# Patient Record
Sex: Male | Born: 2000 | Race: White | Hispanic: No | Marital: Single | Smoking: Never smoker
Health system: Southern US, Community
[De-identification: ages and names within clinical notes are randomized; demographics above are authoritative.]

---
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
## 2022-12-09 ENCOUNTER — Encounter (HOSPITAL_BASED_OUTPATIENT_CLINIC_OR_DEPARTMENT_OTHER): Payer: Self-pay | Admitting: Emergency Medicine

## 2022-12-09 ENCOUNTER — Other Ambulatory Visit: Payer: Self-pay

## 2022-12-09 ENCOUNTER — Emergency Department (HOSPITAL_BASED_OUTPATIENT_CLINIC_OR_DEPARTMENT_OTHER)
Admission: EM | Admit: 2022-12-09 | Discharge: 2022-12-09 | Disposition: A | Discharge status: Home / Self Care | Payer: BC Managed Care – PPO | Attending: Emergency Medicine | Admitting: Emergency Medicine

## 2022-12-09 DIAGNOSIS — L03116 Cellulitis of left lower limb: Secondary | ICD-10-CM | POA: Diagnosis not present

## 2022-12-09 DIAGNOSIS — M79605 Pain in left leg: Secondary | ICD-10-CM | POA: Diagnosis present

## 2022-12-09 DIAGNOSIS — L0291 Cutaneous abscess, unspecified: Secondary | ICD-10-CM

## 2022-12-09 LAB — CBC WITH DIFFERENTIAL/PLATELET
Abs Immature Granulocytes: 0.01 10*3/uL (ref 0.00–0.07)
Basophils Absolute: 0 10*3/uL (ref 0.0–0.1)
Basophils Relative: 0 %
Eosinophils Absolute: 0.3 10*3/uL (ref 0.0–0.5)
Eosinophils Relative: 4 %
HCT: 42.7 % (ref 39.0–52.0)
Hemoglobin: 14.7 g/dL (ref 13.0–17.0)
Immature Granulocytes: 0 %
Lymphocytes Relative: 26 %
Lymphs Abs: 2.3 10*3/uL (ref 0.7–4.0)
MCH: 29.3 pg (ref 26.0–34.0)
MCHC: 34.4 g/dL (ref 30.0–36.0)
MCV: 85.1 fL (ref 80.0–100.0)
Monocytes Absolute: 0.4 10*3/uL (ref 0.1–1.0)
Monocytes Relative: 5 %
Neutro Abs: 5.7 10*3/uL (ref 1.7–7.7)
Neutrophils Relative %: 65 %
Platelets: 377 10*3/uL (ref 150–400)
RBC: 5.02 MIL/uL (ref 4.22–5.81)
RDW: 12.9 % (ref 11.5–15.5)
WBC: 8.8 10*3/uL (ref 4.0–10.5)
nRBC: 0 % (ref 0.0–0.2)

## 2022-12-09 LAB — URINALYSIS, W/ REFLEX TO CULTURE (INFECTION SUSPECTED)
Bacteria, UA: NONE SEEN
Bilirubin Urine: NEGATIVE
Glucose, UA: NEGATIVE mg/dL
Hgb urine dipstick: NEGATIVE
Ketones, ur: NEGATIVE mg/dL
Leukocytes,Ua: NEGATIVE
Nitrite: NEGATIVE
Protein, ur: NEGATIVE mg/dL
Specific Gravity, Urine: 1.027 (ref 1.005–1.030)
pH: 5.5 (ref 5.0–8.0)

## 2022-12-09 LAB — LACTIC ACID, PLASMA: Lactic Acid, Venous: 0.9 mmol/L (ref 0.5–1.9)

## 2022-12-09 LAB — COMPREHENSIVE METABOLIC PANEL
ALT: 26 U/L (ref 0–44)
AST: 19 U/L (ref 15–41)
Albumin: 4.7 g/dL (ref 3.5–5.0)
Alkaline Phosphatase: 45 U/L (ref 38–126)
Anion gap: 10 (ref 5–15)
BUN: 13 mg/dL (ref 6–20)
CO2: 28 mmol/L (ref 22–32)
Calcium: 9.5 mg/dL (ref 8.9–10.3)
Chloride: 102 mmol/L (ref 98–111)
Creatinine, Ser: 0.98 mg/dL (ref 0.61–1.24)
GFR, Estimated: >60 mL/min (ref >60)
Glucose, Bld: 85 mg/dL (ref 70–99)
Potassium: 3.9 mmol/L (ref 3.5–5.1)
Sodium: 140 mmol/L (ref 135–145)
Total Bilirubin: 0.5 mg/dL (ref 0.3–1.2)
Total Protein: 7.8 g/dL (ref 6.5–8.1)

## 2022-12-09 MED ORDER — DOXYCYCLINE HYCLATE 100 MG PO CAPS: 100.0000 mg | ORAL_CAPSULE | Freq: Two times a day (BID) | ORAL | 0 refills | Status: AC

## 2022-12-09 MED ORDER — LIDOCAINE-EPINEPHRINE (PF) 2 %-1:200000 IJ SOLN
10.0000 mL | Freq: Once | INTRAMUSCULAR | Status: AC
  Administered 2022-12-09: 10 mL
  Filled 2022-12-09: qty 20
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
## 2022-12-09 NOTE — ED Notes (Signed)
Discharged by different RN.Marland KitchenMarland Kitchen
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
## 2022-12-09 NOTE — Discharge Instructions (Signed)
As discussed, your abscess was drained while in the emergency department.  Will change antibiotics to doxycycline to take twice daily for the next 6 days.  Wash area gently with warm soapy water.  Change bandages at least daily.  Please do not hesitate to return to emergency department for worrisome signs and symptoms we discussed become apparent.
